# Patient Record
Sex: Female | Born: 1960 | Hispanic: No | Marital: Married | Smoking: Current every day smoker
Health system: Southern US, Community
[De-identification: ages and names within clinical notes are randomized; demographics above are authoritative.]

## PROBLEM LIST (undated history)

## (undated) DIAGNOSIS — I10 Essential (primary) hypertension: Secondary | ICD-10-CM

---

## 2004-12-18 ENCOUNTER — Ambulatory Visit (HOSPITAL_BASED_OUTPATIENT_CLINIC_OR_DEPARTMENT_OTHER): Admission: RE | Admit: 2004-12-18 | Discharge: 2004-12-18 | Payer: Self-pay | Admitting: Urology

## 2004-12-18 ENCOUNTER — Ambulatory Visit (HOSPITAL_COMMUNITY): Admission: RE | Admit: 2004-12-18 | Discharge: 2004-12-18 | Payer: Self-pay | Admitting: Urology

## 2017-09-10 ENCOUNTER — Emergency Department (HOSPITAL_COMMUNITY)
Admission: EM | Admit: 2017-09-10 | Discharge: 2017-09-11 | Disposition: A | Discharge status: Home / Self Care | Payer: BC Managed Care – PPO | Attending: Emergency Medicine | Admitting: Emergency Medicine

## 2017-09-10 ENCOUNTER — Emergency Department (HOSPITAL_COMMUNITY): Payer: BC Managed Care – PPO

## 2017-09-10 ENCOUNTER — Encounter (HOSPITAL_COMMUNITY): Payer: Self-pay | Admitting: *Deleted

## 2017-09-10 DIAGNOSIS — N200 Calculus of kidney: Secondary | ICD-10-CM | POA: Diagnosis not present

## 2017-09-10 DIAGNOSIS — R1012 Left upper quadrant pain: Secondary | ICD-10-CM

## 2017-09-10 DIAGNOSIS — R1032 Left lower quadrant pain: Secondary | ICD-10-CM | POA: Diagnosis present

## 2017-09-10 HISTORY — DX: Essential (primary) hypertension: I10

## 2017-09-10 LAB — COMPREHENSIVE METABOLIC PANEL
ALT: 32 U/L (ref 14–54)
AST: 48 U/L — ABNORMAL HIGH (ref 15–41)
Albumin: 4.5 g/dL (ref 3.5–5.0)
Alkaline Phosphatase: 93 U/L (ref 38–126)
Anion gap: 10 (ref 5–15)
BUN: 10 mg/dL (ref 6–20)
CO2: 25 mmol/L (ref 22–32)
Calcium: 10.1 mg/dL (ref 8.9–10.3)
Chloride: 99 mmol/L — ABNORMAL LOW (ref 101–111)
Creatinine, Ser: 0.72 mg/dL (ref 0.44–1.00)
GFR calc Af Amer: >60 mL/min (ref >60)
GFR calc non Af Amer: >60 mL/min (ref >60)
Glucose, Bld: 84 mg/dL (ref 65–99)
Potassium: 4.2 mmol/L (ref 3.5–5.1)
Sodium: 134 mmol/L — ABNORMAL LOW (ref 135–145)
Total Bilirubin: 1.7 mg/dL — ABNORMAL HIGH (ref 0.3–1.2)
Total Protein: 7.8 g/dL (ref 6.5–8.1)

## 2017-09-10 LAB — CBC
HCT: 40.5 % (ref 36.0–46.0)
Hemoglobin: 13.8 g/dL (ref 12.0–15.0)
MCH: 32.9 pg (ref 26.0–34.0)
MCHC: 34.1 g/dL (ref 30.0–36.0)
MCV: 96.4 fL (ref 78.0–100.0)
Platelets: 207 10*3/uL (ref 150–400)
RBC: 4.2 MIL/uL (ref 3.87–5.11)
RDW: 12.3 % (ref 11.5–15.5)
WBC: 5.5 10*3/uL (ref 4.0–10.5)

## 2017-09-10 LAB — LIPASE, BLOOD: Lipase: 42 U/L (ref 11–51)

## 2017-09-10 MED ORDER — MORPHINE SULFATE (PF) 4 MG/ML IV SOLN
4.0000 mg | Freq: Once | INTRAVENOUS | Status: AC
  Administered 2017-09-10: 4 mg via INTRAVENOUS
  Filled 2017-09-10: qty 1

## 2017-09-10 MED ORDER — IOHEXOL 300 MG/ML  SOLN
100.0000 mL | Freq: Once | INTRAMUSCULAR | Status: AC | PRN
  Administered 2017-09-10: 100 mL via INTRAVENOUS

## 2017-09-10 MED ORDER — ONDANSETRON HCL 4 MG/2ML IJ SOLN
4.0000 mg | Freq: Once | INTRAMUSCULAR | Status: AC
  Administered 2017-09-10: 4 mg via INTRAVENOUS
  Filled 2017-09-10: qty 2

## 2017-09-10 MED ORDER — SODIUM CHLORIDE 0.9 % IV BOLUS
500.0000 mL | Freq: Once | INTRAVENOUS | Status: AC
  Administered 2017-09-10: 500 mL via INTRAVENOUS

## 2017-09-10 NOTE — ED Triage Notes (Signed)
Pt in c/o left lower quad pain that started last week, worse this past weekend, went to an urgent care a few days ago but they did not have a CT and they did not ever call to set one up- due to continued pain patient came in for further evaluation, also reports constipation during the last two days which is not normal for her, also nausea but denies vomiting

## 2017-09-10 NOTE — ED Provider Notes (Signed)
Patient placed in Quick Look pathway, seen and evaluated   Chief Complaint: Abdominal pain  HPI:   Patient states that last week, she had mild left lower quadrant abdominal pain.  On Sunday, pain worsened, she was evaluated at urgent care, where a CT scan was recommended, but not able to be performed.  Patient was tachycardic with a low-grade fever of 99.6 at the time.  Patient has not been able to get a CT scan since.  She reports worsening lower abdominal pain with associated nausea.  No vomiting.  She denies fevers, chills, chest pain, shortness of breath.  She has not had a bowel movement in the past several days, which is very abnormal for her.  Prior to being unable to have a bowel movement, she had to strain and very minimal stool was passed.  She reports urinary frequency and feeling her urine is hot, no hematuria.  She denies vaginal discharge or discomfort.  She has a history of high blood pressure for which she takes medication, no other medical problems.  C-section 30 years ago, no other abdominal surgeries.  ROS: Abdominal pain  Physical Exam:   Gen: No distress  Neuro: Awake and Alert  Skin: Warm    Focused Exam: Tenderness palpation of lower abdomen and epigastric abdomen, worst in the left lower quadrant.  No rigidity or distention.  Patient is mildly tachycardic and appears uncomfortable due to pain.  Will obtain abdominal labs, urine, and CT.  Patient given for nausea and pain.   Initiation of care has begun. The patient has been counseled on the process, plan, and necessity for staying for the completion/evaluation, and the remainder of the medical screening examination    Alveria Apley, PA-C 09/10/17 1849    Raeford Razor, MD 09/11/17 [id number]

## 2017-09-11 LAB — URINALYSIS, ROUTINE W REFLEX MICROSCOPIC
Bilirubin Urine: NEGATIVE
Glucose, UA: NEGATIVE mg/dL
Hgb urine dipstick: NEGATIVE
Ketones, ur: 5 mg/dL — AB
Leukocytes, UA: NEGATIVE
Nitrite: NEGATIVE
Protein, ur: NEGATIVE mg/dL
Specific Gravity, Urine: 1.044 — ABNORMAL HIGH (ref 1.005–1.030)
pH: 6 (ref 5.0–8.0)

## 2017-09-11 MED ORDER — NAPROXEN 500 MG PO TABS: 500.0000 mg | ORAL_TABLET | Freq: Two times a day (BID) | ORAL | 0 refills | Status: AC

## 2017-09-11 MED ORDER — ONDANSETRON 4 MG PO TBDP: 4.0000 mg | ORAL_TABLET | Freq: Three times a day (TID) | ORAL | 0 refills | Status: AC | PRN

## 2019-09-13 IMAGING — CT CT ABD-PELV W/ CM
2 of 5 series · 16 of 46 positions shown, 18 images · IV contrast (Omni 300)
Comparison: None.

CLINICAL DATA: Left lower quadrant abdominal pain

EXAM:
CT ABDOMEN AND PELVIS WITH CONTRAST
TECHNIQUE: Multidetector CT imaging of the abdomen and pelvis was performed
using the standard protocol following bolus administration of
intravenous contrast.
CONTRAST:  100mL OMNIPAQUE IOHEXOL 300 MG/ML  SOLN

[Series 3: a/p w/ 5mm · axial · 0.67mm/px · z∈[+814,+1149]mm · 13 of 77 slices shown, 15 images]
[im 5/77  soft-tissue]
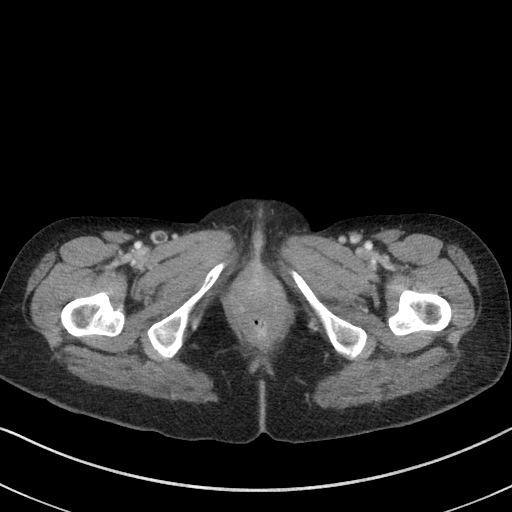
[im 5/77  bone]
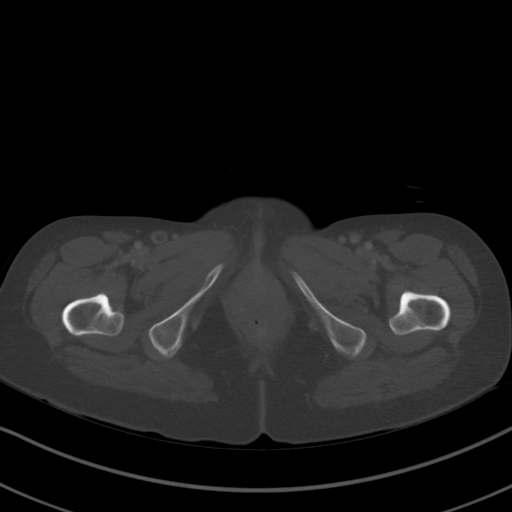
[im 10/77  soft-tissue]
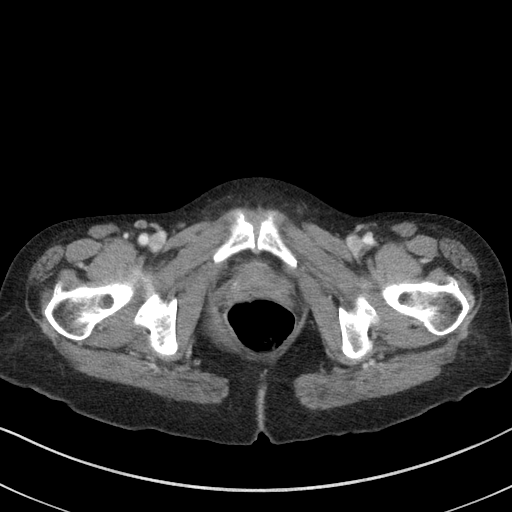
[im 15/77  soft-tissue]
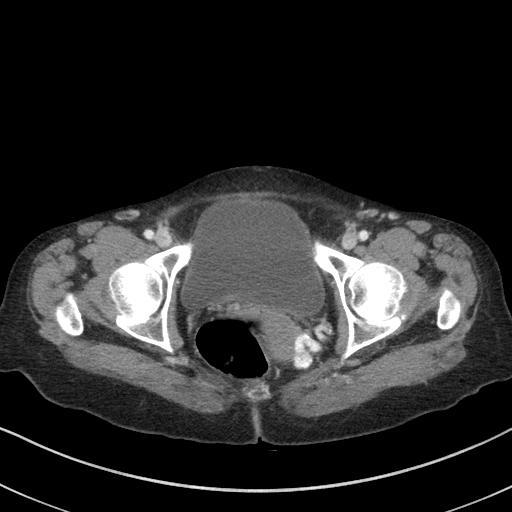
[im 24/77  soft-tissue]
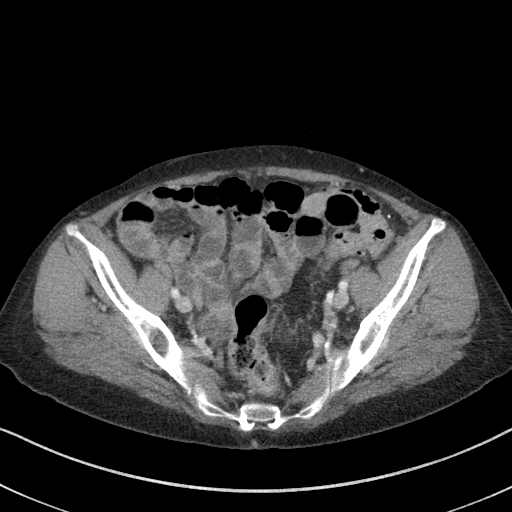
[im 29/77  soft-tissue]
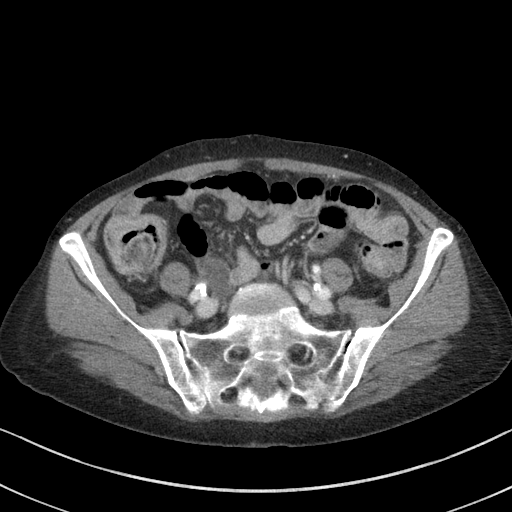
[im 34/77  soft-tissue]
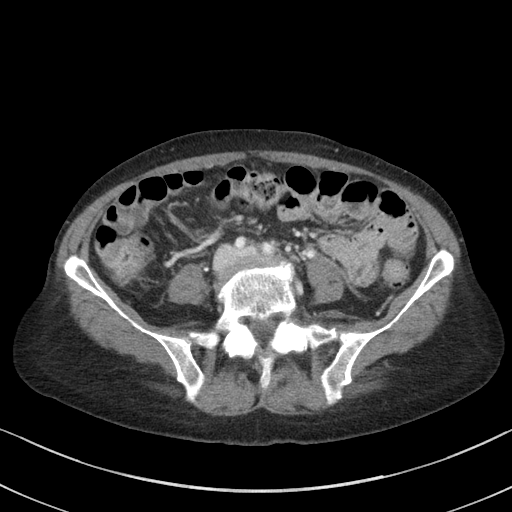
[im 39/77  soft-tissue]
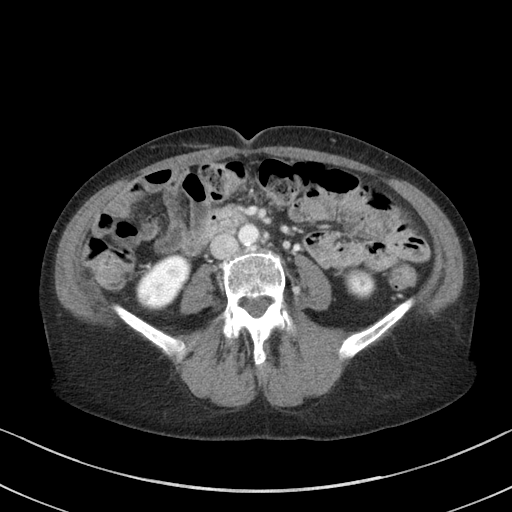
[im 43/77  soft-tissue]
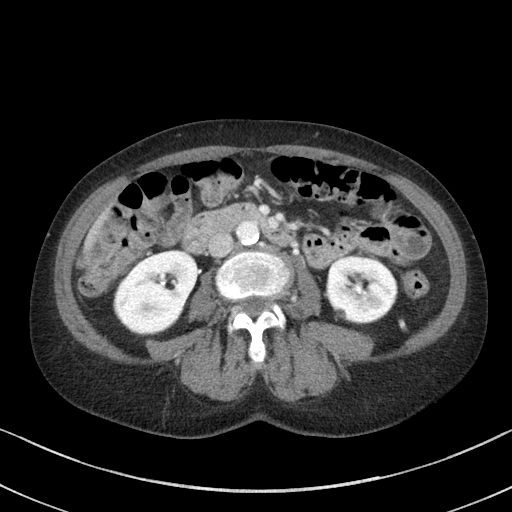
[im 48/77  soft-tissue]
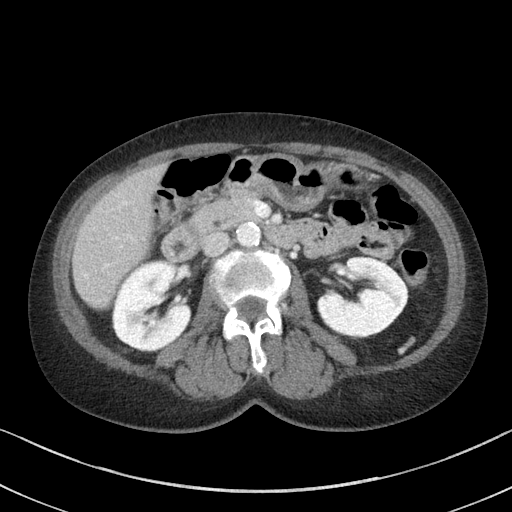
[im 48/77  bone]
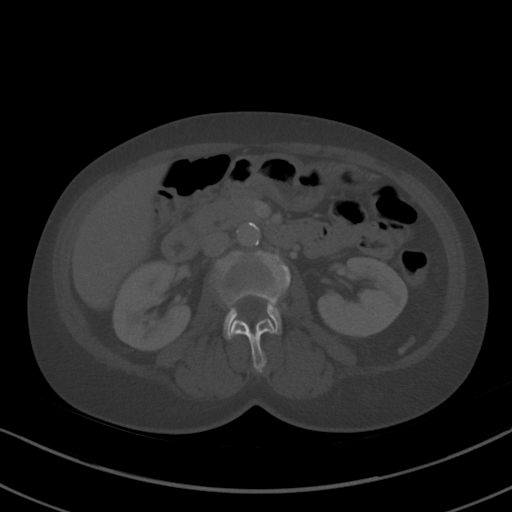
[im 53/77  soft-tissue]
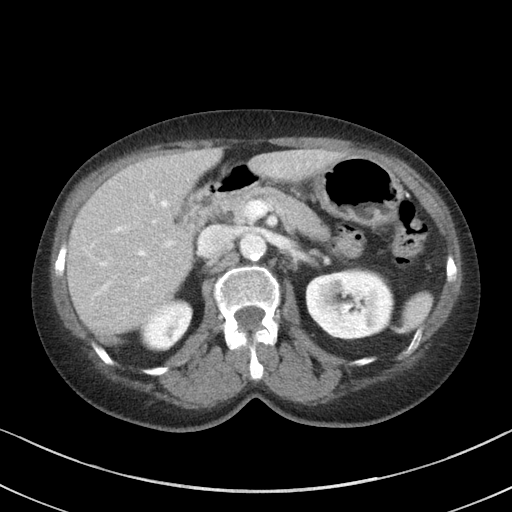
[im 62/77  soft-tissue]
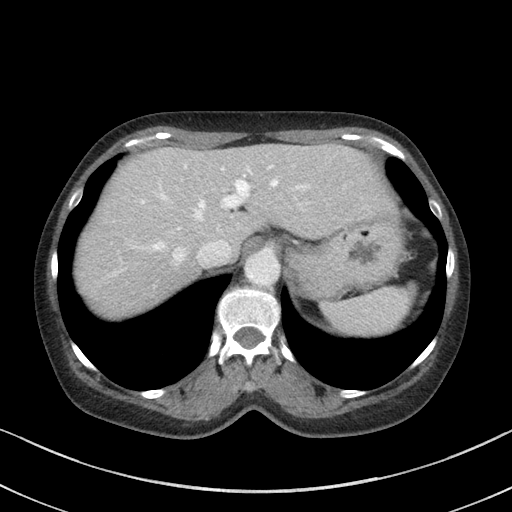
[im 67/77  soft-tissue]
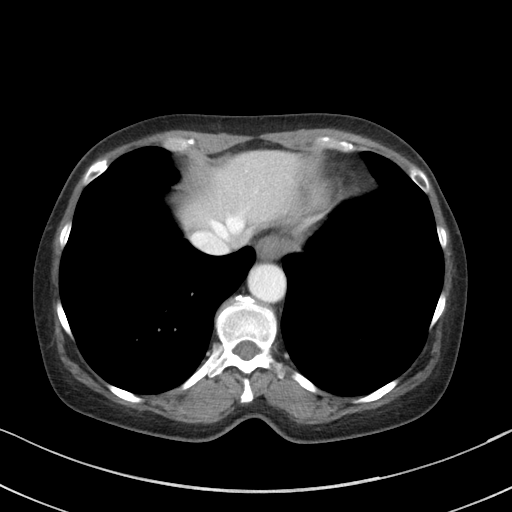
[im 72/77  soft-tissue]
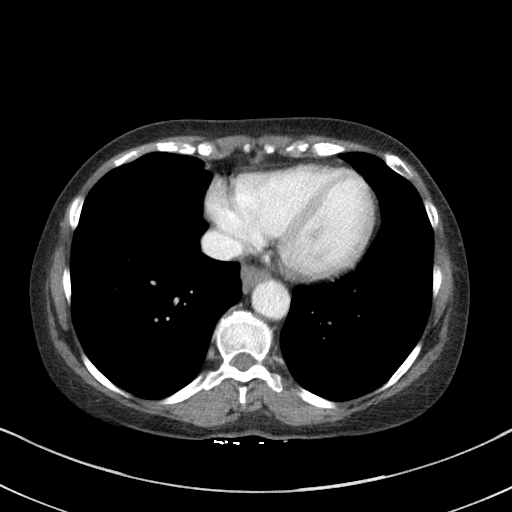

[Series 6: a/p w/ cor · coronal · 0.62mm/px · 3 of 111 slices shown]
[im 37/111  soft-tissue]
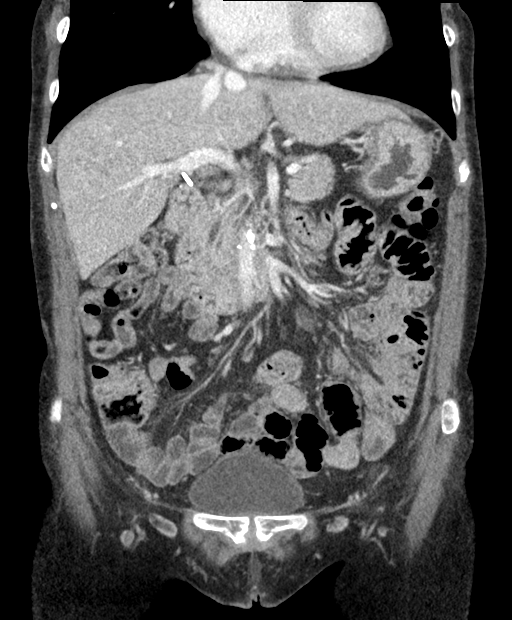
[im 49/111  soft-tissue]
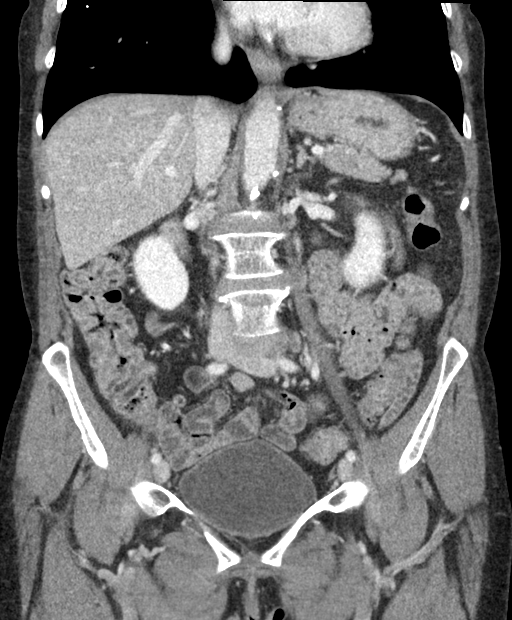
[im 62/111  soft-tissue]
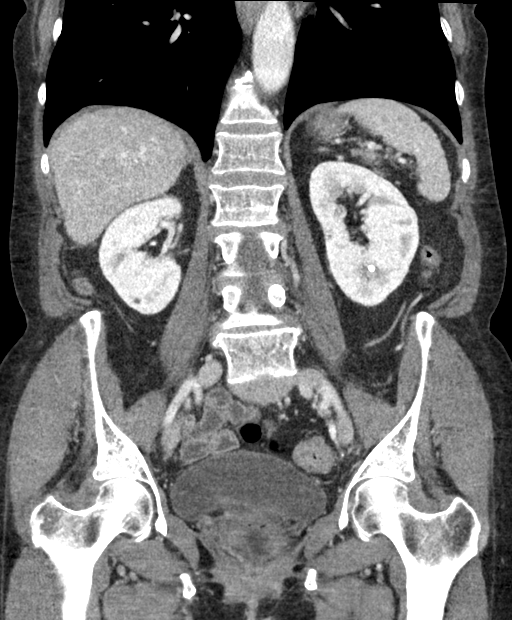

[16 of 46 positions shown; findings below may reference images not displayed]

FINDINGS: Lower chest: Lung bases are clear.

Hepatobiliary: Liver is within normal limits, noting focal
fat/altered perfusion along the falciform ligament.

Status post cholecystectomy. No intrahepatic or extrahepatic ductal
dilatation.

Pancreas: Within normal limits.

Spleen: Within normal limits.

Adrenals/Urinary Tract: Adrenal glands are within normal limits.

9 mm left upper pole renal cyst. Early excretory contrast in the
bilateral renal collecting systems. Suspected 5 mm nonobstructing
left lower pole renal calculus.

Bladder is within normal limits.

Stomach/Bowel: Stomach is within normal limits.

No evidence of bowel obstruction.

Normal appendix (series 3/image 52).

Left colon is decompressed.

Vascular/Lymphatic: No evidence of abdominal aortic aneurysm.

Atherosclerotic calcifications of the abdominal aorta and branch
vessels.

No suspicious abdominopelvic lymphadenopathy.

Reproductive: Uterus is notable for a calcified anterior uterine
body fibroid measuring 8 mm (series 3/image 59).

No adnexal masses.

Other: No abdominopelvic ascites.

Musculoskeletal: Visualized osseous structures are within normal
limits.
IMPRESSION: 5 mm nonobstructing left lower pole renal calculus. No
hydronephrosis.

Otherwise, no CT findings to account for the patient's abdominal
pain.
